# Patient Record
Sex: Female | Born: 2005 | Race: White | Hispanic: No | Marital: Single
Health system: Southern US, Community
[De-identification: ages and names within clinical notes are randomized; demographics above are authoritative.]

## PROBLEM LIST (undated history)

## (undated) DIAGNOSIS — J45909 Unspecified asthma, uncomplicated: Secondary | ICD-10-CM

---
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
## 2024-05-09 ENCOUNTER — Emergency Department (HOSPITAL_COMMUNITY)
Admission: EM | Admit: 2024-05-09 | Discharge: 2024-05-09 | Disposition: A | Discharge status: Home / Self Care | Payer: MEDICAID | Attending: Emergency Medicine | Admitting: Emergency Medicine

## 2024-05-09 ENCOUNTER — Other Ambulatory Visit: Payer: Self-pay

## 2024-05-09 ENCOUNTER — Encounter (HOSPITAL_COMMUNITY): Payer: Self-pay | Admitting: Emergency Medicine

## 2024-05-09 DIAGNOSIS — N898 Other specified noninflammatory disorders of vagina: Secondary | ICD-10-CM | POA: Insufficient documentation

## 2024-05-09 DIAGNOSIS — Z202 Contact with and (suspected) exposure to infections with a predominantly sexual mode of transmission: Secondary | ICD-10-CM | POA: Insufficient documentation

## 2024-05-09 DIAGNOSIS — Z711 Person with feared health complaint in whom no diagnosis is made: Secondary | ICD-10-CM

## 2024-05-09 LAB — GC/CHLAMYDIA PROBE AMP (~~LOC~~) NOT AT ARMC
Chlamydia: NEGATIVE
Comment: NEGATIVE
Comment: NORMAL
Neisseria Gonorrhea: NEGATIVE

## 2024-05-09 LAB — URINALYSIS, ROUTINE W REFLEX MICROSCOPIC
Bilirubin Urine: NEGATIVE
Glucose, UA: >=500 mg/dL — AB
Ketones, ur: 5 mg/dL — AB
Nitrite: NEGATIVE
Protein, ur: NEGATIVE mg/dL
Specific Gravity, Urine: 1.042 — ABNORMAL HIGH (ref 1.005–1.030)
pH: 6 (ref 5.0–8.0)

## 2024-05-09 LAB — HIV ANTIBODY (ROUTINE TESTING W REFLEX): HIV Screen 4th Generation wRfx: NONREACTIVE

## 2024-05-09 LAB — PREGNANCY, URINE: Preg Test, Ur: NEGATIVE

## 2024-05-09 MED ORDER — CEPHALEXIN 500 MG PO CAPS
500.0000 mg | ORAL_CAPSULE | Freq: Four times a day (QID) | ORAL | 0 refills | Status: AC
Start: 2024-05-09 — End: ?

## 2024-05-09 MED ORDER — VALACYCLOVIR HCL 1 G PO TABS: 1000.0000 mg | ORAL_TABLET | Freq: Two times a day (BID) | ORAL | 0 refills | Status: AC

## 2024-05-09 MED ORDER — FLUCONAZOLE 150 MG PO TABS: 150.0000 mg | ORAL_TABLET | Freq: Every day | ORAL | 1 refills | Status: AC

## 2024-05-09 NOTE — ED Triage Notes (Signed)
 Pt noticed blisters to genitals and foul odor to discharge today

## 2024-05-09 NOTE — Discharge Instructions (Addendum)
 Your herpes test is pending.    Your exam is concerning for genital herpes.Check your my chart for results.  Take medications as directed.  Follow up with your primary care physician for further evaluation and treatment

## 2024-05-09 NOTE — ED Triage Notes (Signed)
 Pt arrive POV from home for c/o vaginal discharge that started few days ago with vaginal irritation.

## 2024-05-09 NOTE — ED Provider Notes (Signed)
 [place] EMERGENCY DEPARTMENT AT [facility] Provider Note   CSN: [id number] Arrival date & time: 05/09/24  0447     Patient presents with: Vaginal Discharge   Alexis Watson is a 18 y.o. female.   Patient complains of vaginal lesions.  Patient reports pain with urinating and pain with wiping.  Patient reports that she has multiple sores.  Patient reports she had similar once in the past.  Patient states that she does have a history of yeast infections.  Patient is unsure of STD risk.  Patient denies any fever or chills she is not having any abdominal pain.  The history is provided by the patient. No language interpreter was used.  Vaginal Discharge Quality:  White Severity:  Severe Onset quality:  Sudden      Prior to Admission medications   Medication Sig Start Date End Date Taking? Authorizing Provider  cephALEXin  (KEFLEX ) 500 MG capsule Take 1 capsule (500 mg total) by mouth 4 (four) times daily. 05/09/24  Yes Catheline Hixon K, PA-C  fluconazole  (DIFLUCAN ) 150 MG tablet Take 1 tablet (150 mg total) by mouth daily. 05/09/24  Yes Renso Swett K, PA-C  valACYclovir  (VALTREX ) 1000 MG tablet Take 1 tablet (1,000 mg total) by mouth 2 (two) times daily for 10 days. 05/09/24 05/19/24 Yes Flint Sonny POUR, PA-C  cetirizine  (ZYRTEC ) 1 MG/ML syrup Take 10 mLs (10 mg total) by mouth daily. 12/03/13   Dammen, Peter, PA-C  guaiFENesin  (ROBITUSSIN) 100 MG/5ML liquid Take 5-10 mLs (100-200 mg total) by mouth every 4 (four) hours as needed for cough. 12/03/13   Dammen, Peter, PA-C  pseudoephedrine  (SUDAFED) 30 MG/5ML syrup Take 5 mLs (30 mg total) by mouth 4 (four) times daily as needed for congestion. 12/03/13   Dammen, Peter, PA-C    Allergies: Patient has no known allergies.    Review of Systems  Genitourinary:  Positive for vaginal discharge.  All other systems reviewed and are negative.   Updated Vital Signs BP 107/82 (BP Location: Right Arm)   Pulse 89   Temp 98.1 F (36.7 C)  (Oral)   Resp 15   Ht 5' 8 (1.727 m)   Wt 72.6 kg   LMP 05/04/2024   SpO2 100%   BMI 24.33 kg/m   Physical Exam Vitals and nursing note reviewed.  Constitutional:      Appearance: She is well-developed.  HENT:     Head: Normocephalic.  Cardiovascular:     Rate and Rhythm: Normal rate.  Pulmonary:     Effort: Pulmonary effort is normal.  Abdominal:     General: There is no distension.  Genitourinary:    Vagina: Vaginal discharge present.     Comments: Multiple labial blisters, thick white discharge, Musculoskeletal:        General: Normal range of motion.     Cervical back: Normal range of motion.  Skin:    General: Skin is warm.  Neurological:     General: No focal deficit present.     Mental Status: She is alert and oriented to person, place, and time.     (all labs ordered are listed, but only abnormal results are displayed) Labs Reviewed  URINALYSIS, ROUTINE W REFLEX MICROSCOPIC - Abnormal; Notable for the following components:      Result Value   APPearance HAZY (*)    Specific Gravity, Urine 1.042 (*)    Glucose, UA >=500 (*)    Hgb urine dipstick SMALL (*)    Ketones, ur 5 (*)  Leukocytes,Ua MODERATE (*)    Bacteria, UA MANY (*)    All other components within normal limits  HSV CULTURE AND TYPING  WET PREP, GENITAL  HIV ANTIBODY (ROUTINE TESTING W REFLEX)  PREGNANCY, URINE  GC/CHLAMYDIA PROBE AMP ([place]) NOT AT [facility]    EKG: None  Radiology: No results found.   Procedures   Medications Ordered in the ED - No data to display                                  Medical Decision Making Patient complains of multiple vaginal lesions.  Amount and/or Complexity of Data Reviewed Labs: ordered. Decision-making details documented in ED Course.    Details: HIV is negative UA shows white blood cells greater than 50.  Risk Prescription drug management. Risk Details: I was unable to obtain a wet prep, patient may have yeast, I will treat her  with Keflex  for UTI valacyclovir  for presumptive herpes and give her a dose of Diflucan  for possible yeast.  Patient is discharged in stable condition I have advised her she needs to follow-up with her primary care.        Final diagnoses:  Concern about STD in female without diagnosis    ED Discharge Orders          Ordered    valACYclovir  (VALTREX ) 1000 MG tablet  2 times daily        05/09/24 1025    fluconazole  (DIFLUCAN ) 150 MG tablet  Daily        05/09/24 1025    cephALEXin  (KEFLEX ) 500 MG capsule  4 times daily        05/09/24 1025           Lab reports first wet prep was spilled, second specimen collected, lab reports never receiving the specimen.  Patient refuses third swab.  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  An After Visit Summary was printed and given to the patient.    Flint Sonny POUR, PA-C 05/09/24 1120    Ruthe Cornet, DO 05/09/24 1247

## 2024-05-11 LAB — HSV CULTURE AND TYPING
